# Patient Record
Sex: Male | Born: 1990 | Race: Black or African American | Hispanic: No | Marital: Single
Health system: Southern US, Community
[De-identification: ages and names within clinical notes are randomized; demographics above are authoritative.]

---
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
## 2010-05-05 ENCOUNTER — Encounter: Payer: Self-pay | Admitting: Family Medicine

## 2020-03-07 ENCOUNTER — Other Ambulatory Visit: Payer: Self-pay

## 2020-03-07 ENCOUNTER — Emergency Department: Payer: 59

## 2020-03-07 ENCOUNTER — Encounter: Payer: Self-pay | Admitting: Emergency Medicine

## 2020-03-07 ENCOUNTER — Emergency Department
Admission: EM | Admit: 2020-03-07 | Discharge: 2020-03-07 | Disposition: A | Discharge status: Home / Self Care | Payer: 59 | Attending: Emergency Medicine | Admitting: Emergency Medicine

## 2020-03-07 DIAGNOSIS — Y9289 Other specified places as the place of occurrence of the external cause: Secondary | ICD-10-CM | POA: Diagnosis not present

## 2020-03-07 DIAGNOSIS — W3400XA Accidental discharge from unspecified firearms or gun, initial encounter: Secondary | ICD-10-CM

## 2020-03-07 DIAGNOSIS — S71102A Unspecified open wound, left thigh, initial encounter: Secondary | ICD-10-CM | POA: Diagnosis present

## 2020-03-07 DIAGNOSIS — S71132A Puncture wound without foreign body, left thigh, initial encounter: Secondary | ICD-10-CM | POA: Diagnosis not present

## 2020-03-07 DIAGNOSIS — Y9389 Activity, other specified: Secondary | ICD-10-CM | POA: Insufficient documentation

## 2020-03-07 LAB — COMPREHENSIVE METABOLIC PANEL
ALT: 22 U/L (ref 0–44)
AST: 25 U/L (ref 15–41)
Albumin: 4 g/dL (ref 3.5–5.0)
Alkaline Phosphatase: 103 U/L (ref 38–126)
Anion gap: 10 (ref 5–15)
BUN: 10 mg/dL (ref 6–20)
CO2: 26 mmol/L (ref 22–32)
Calcium: 9.3 mg/dL (ref 8.9–10.3)
Chloride: 104 mmol/L (ref 98–111)
Creatinine, Ser: 0.86 mg/dL (ref 0.61–1.24)
GFR, Estimated: >60 mL/min (ref >60)
Glucose, Bld: 99 mg/dL (ref 70–99)
Potassium: 3.6 mmol/L (ref 3.5–5.1)
Sodium: 140 mmol/L (ref 135–145)
Total Bilirubin: 1 mg/dL (ref 0.3–1.2)
Total Protein: 7.4 g/dL (ref 6.5–8.1)

## 2020-03-07 LAB — TYPE AND SCREEN
ABO/RH(D): O POS
Antibody Screen: NEGATIVE

## 2020-03-07 LAB — CBC
HCT: 44.4 % (ref 39.0–52.0)
Hemoglobin: 15.2 g/dL (ref 13.0–17.0)
MCH: 32.8 pg (ref 26.0–34.0)
MCHC: 34.2 g/dL (ref 30.0–36.0)
MCV: 95.7 fL (ref 80.0–100.0)
Platelets: 252 10*3/uL (ref 150–400)
RBC: 4.64 MIL/uL (ref 4.22–5.81)
RDW: 11 % — ABNORMAL LOW (ref 11.5–15.5)
WBC: 18.3 10*3/uL — ABNORMAL HIGH (ref 4.0–10.5)
nRBC: 0 % (ref 0.0–0.2)

## 2020-03-07 LAB — ABO/RH: ABO/RH(D): O POS

## 2020-03-07 IMAGING — DX DG FEMUR 2+V PORT*L*
4 series · 4 of 4 positions shown · non-contrast
Comparison: None.

CLINICAL DATA: Pain

EXAM:
LEFT FEMUR PORTABLE 2 VIEWS

[femur ap (1 of 2)]
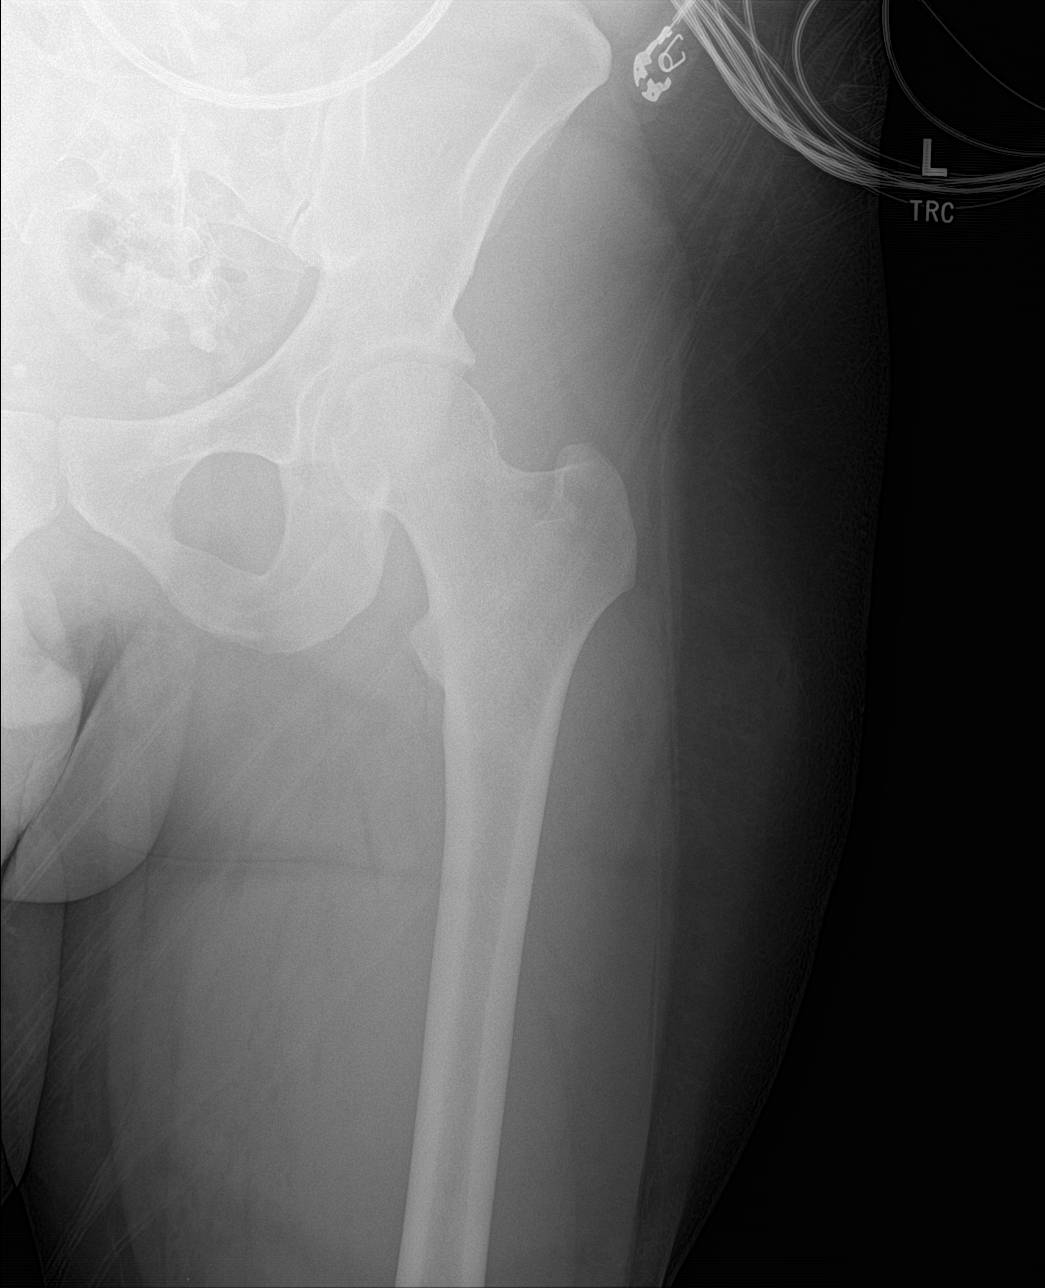

[femur ap (2 of 2)]
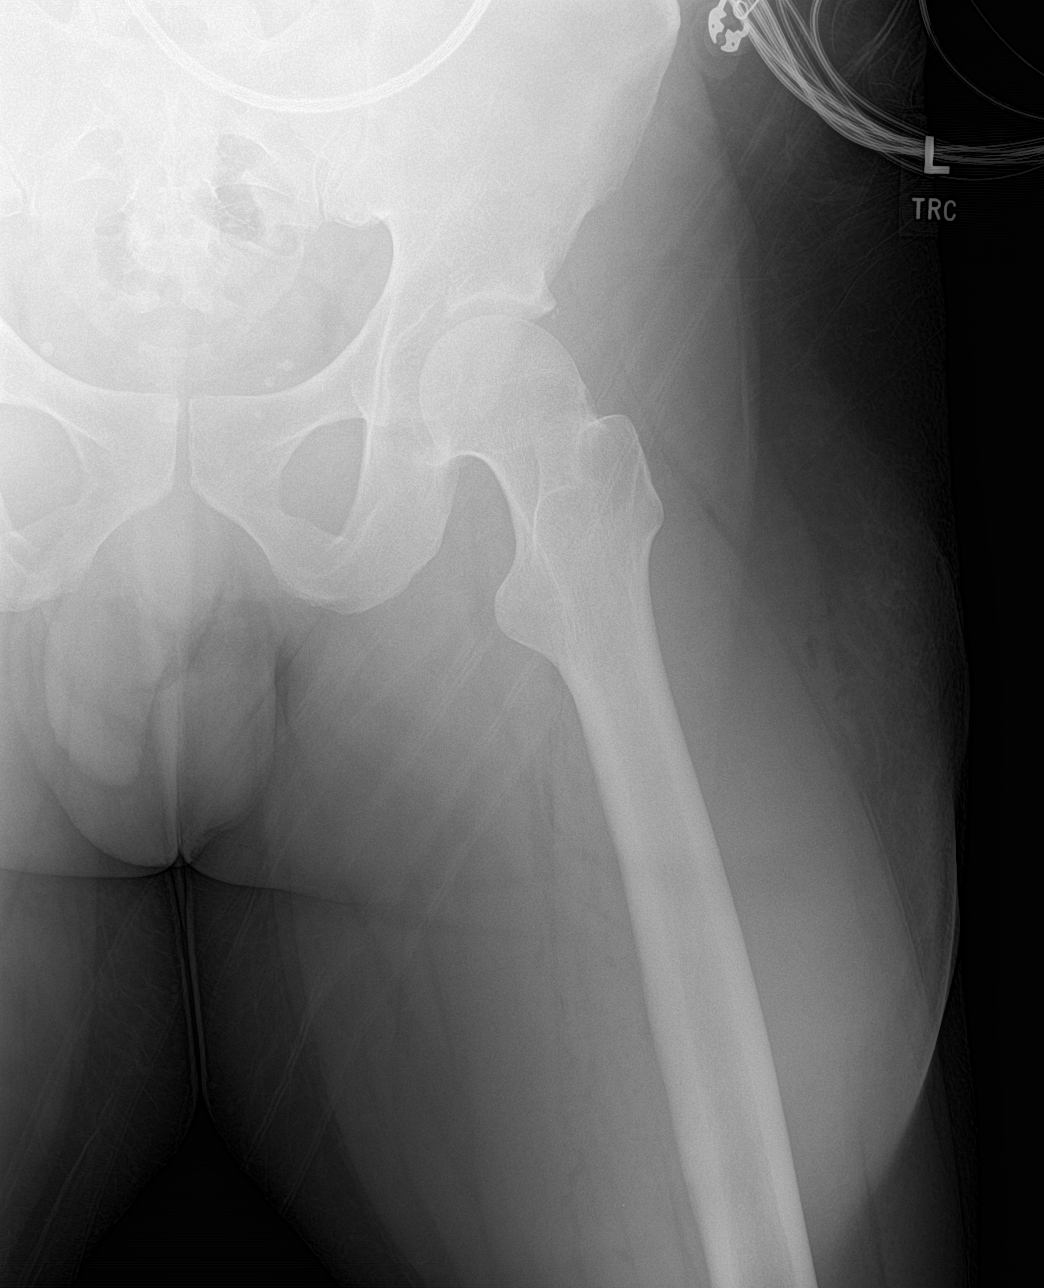

[femur lat (1 of 2)]
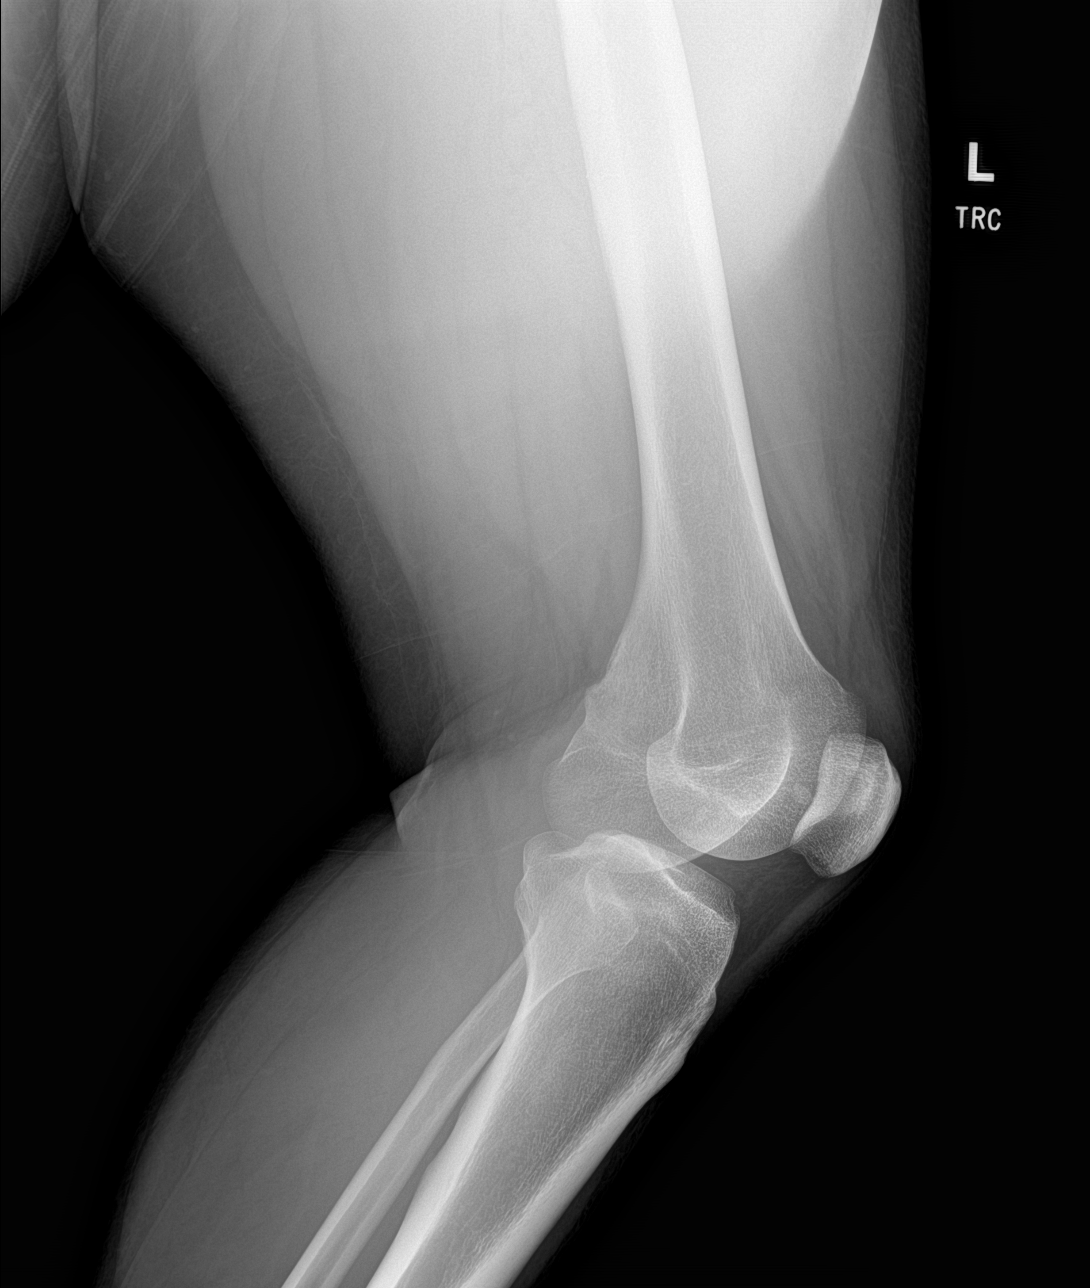

[femur lat (2 of 2)]
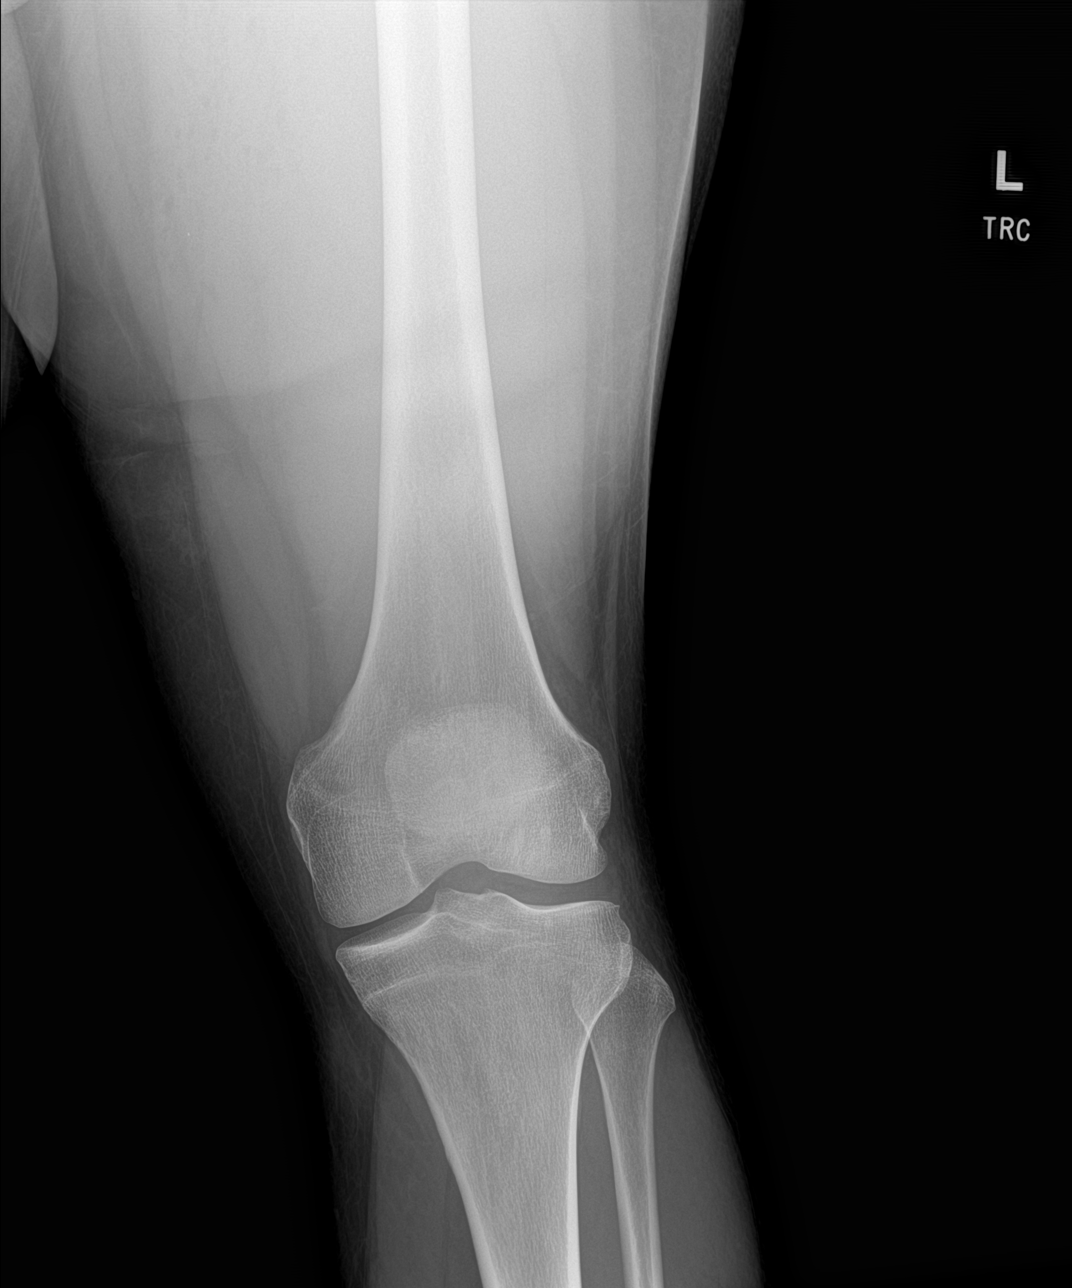

[4 of 4 positions shown; findings below may reference images not displayed]

FINDINGS: There is no acute displaced fracture or dislocation. There is soft
tissue swelling and pockets of subcutaneous gas at the level of the
lateral proximal left thigh.
IMPRESSION: No acute displaced fracture or dislocation. Soft tissue swelling and
pockets of subcutaneous gas are noted.

## 2020-03-07 MED ORDER — BACITRACIN-NEOMYCIN-POLYMYXIN 400-5-5000 EX OINT: TOPICAL_OINTMENT | Freq: Once | CUTANEOUS | Status: DC

## 2020-03-07 MED ORDER — BACITRACIN ZINC 500 UNIT/GM EX OINT
TOPICAL_OINTMENT | Freq: Every day | CUTANEOUS | Status: DC
  Administered 2020-03-07: 1 via TOPICAL
  Filled 2020-03-07: qty 0.9

## 2020-03-07 MED ORDER — MORPHINE SULFATE (PF) 4 MG/ML IV SOLN
4.0000 mg | Freq: Once | INTRAVENOUS | Status: AC
  Administered 2020-03-07: 4 mg via INTRAVENOUS
  Filled 2020-03-07: qty 1

## 2020-03-07 MED ORDER — KETOROLAC TROMETHAMINE 30 MG/ML IJ SOLN
30.0000 mg | Freq: Once | INTRAMUSCULAR | Status: AC
  Administered 2020-03-07: 30 mg via INTRAVENOUS
  Filled 2020-03-07: qty 1

## 2020-03-07 MED ORDER — IOHEXOL 350 MG/ML SOLN
125.0000 mL | Freq: Once | INTRAVENOUS | Status: AC | PRN
  Administered 2020-03-07: 125 mL via INTRAVENOUS

## 2020-03-07 MED ORDER — BACITRACIN ZINC 500 UNIT/GM EX OINT: TOPICAL_OINTMENT | Freq: Every day | CUTANEOUS | Status: DC
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
## 2020-03-07 NOTE — ED Notes (Signed)
PD at bedside.
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
## 2020-03-07 NOTE — ED Notes (Signed)
ED Provider at bedside.
